# Patient Record
Sex: Female | Born: 2000 | Hispanic: Yes | Marital: Single | Smoking: Never smoker
Health system: Southern US, Community
[De-identification: ages and names within clinical notes are randomized; demographics above are authoritative.]

---
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
## 2022-03-30 ENCOUNTER — Ambulatory Visit: Payer: Medicaid Other | Admitting: Podiatry

## 2022-04-10 ENCOUNTER — Ambulatory Visit: Payer: Medicaid Other | Admitting: Podiatry

## 2022-06-20 ENCOUNTER — Ambulatory Visit: Payer: Medicaid Other | Admitting: Podiatry

## 2023-03-01 ENCOUNTER — Ambulatory Visit: Payer: Medicaid Other | Admitting: Podiatry

## 2023-03-08 ENCOUNTER — Ambulatory Visit: Payer: Medicaid Other | Admitting: Podiatry

## 2023-03-08 DIAGNOSIS — L600 Ingrowing nail: Secondary | ICD-10-CM | POA: Diagnosis not present

## 2023-03-08 DIAGNOSIS — L603 Nail dystrophy: Secondary | ICD-10-CM

## 2023-03-08 MED ORDER — TERBINAFINE HCL 250 MG PO TABS: 250.0000 mg | ORAL_TABLET | Freq: Every day | ORAL | 2 refills | Status: AC

## 2023-03-08 NOTE — Patient Instructions (Signed)

## 2023-03-08 NOTE — Progress Notes (Signed)
  Subjective:  Patient ID: Megan Leonard, female    DOB: July 02, 2000,  MRN: [id number]  Chief Complaint  Patient presents with   Ingrown Toenail    Bilateral     22 y.o. female presents with concern for ingrown nail on the bilateral hallux nail.  Having pain with both nails due to the thickness and abnormal growth.  She also notes that there is discoloration to the nails.  No past medical history on file.  No Known Allergies  ROS: Negative except as per HPI above  Objective:  General: AAO x3, NAD  Dermatological: Incurvation is present along the proximal nail border of the bilateral great toe. There is localized edema without any erythema or increase in warmth around the nail border.  There is thickness dystrophy and discoloration of bilateral hallux nails  Vascular:  Dorsalis Pedis artery and Posterior Tibial artery pedal pulses are 2/4 bilateral.  Capillary fill time < 3 sec to all digits.   Neruologic: Grossly intact via light touch bilateral. Protective threshold intact to all sites bilateral.   Musculoskeletal: No gross boney pedal deformities bilateral. No pain, crepitus, or limitation noted with foot and ankle range of motion bilateral. Muscular strength 5/5 in all groups tested bilateral.  Gait: Unassisted, Nonantalgic.   No images are attached to the encounter.   Assessment:   1. Ingrown nail of great toe of right foot   2. Ingrown nail of great toe of left foot   3. Nail dystrophy      Plan:  Patient was evaluated and treated and all questions answered.  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  Nails will regrow. -While they regard recommend treating with 90 days of oral terbinafine 250 mg once daily.  E Rx sent to the patient's pharmacy.  Discussed risk of hepatic toxicity with this. -Patient elects to proceed with minor surgery to remove ingrown toenail today. Consent  reviewed and signed by patient. -Ingrown nail excised. See procedure note. -Educated on post-procedure care including soaking. Written instructions provided and reviewed. -Patient to follow up in 2 weeks for nail check.  Procedure: Excision of Ingrown Toenail Location: Bilateral 1st toe total nail  Anesthesia: Lidocaine 1% plain; 1.5 mL and Marcaine 0.5% plain; 1.5 mL, digital block. Skin Prep: Betadine. Dressing: Silvadene; telfa; dry, sterile, compression dressing. Technique: Following skin prep, the toe was exsanguinated and a tourniquet was secured at the base of the toe. The affected nail border was freed, split with a nail splitter, and excised.  Nailbed was irrigated out with alcohol. The tourniquet was then removed and sterile dressing applied. Disposition: Patient tolerated procedure well. Patient to return in 2 weeks for follow-up.    Return in about 2 weeks (around 03/22/2023) for nail check.          Corinna Gab, DPM [facility]

## 2023-03-09 ENCOUNTER — Telehealth: Payer: Self-pay | Admitting: Podiatry

## 2023-03-09 NOTE — Telephone Encounter (Signed)
Pt is coming in to pick up a work note for today.

## 2023-03-12 ENCOUNTER — Encounter: Payer: Self-pay | Admitting: Podiatry

## 2023-03-22 ENCOUNTER — Ambulatory Visit: Payer: Medicaid Other | Admitting: Podiatry

## 2023-03-22 ENCOUNTER — Encounter: Payer: Self-pay | Admitting: Podiatry

## 2023-03-22 VITALS — Ht 61.0 in | Wt 90.0 lb

## 2023-03-22 DIAGNOSIS — L600 Ingrowing nail: Secondary | ICD-10-CM

## 2023-03-22 NOTE — Progress Notes (Signed)
       Subjective:  Patient ID: Megan Leonard, female    DOB: 2001-03-06,  MRN: [id number]  Chief Complaint  Patient presents with   Nail Problem    2 week F/U right ingrown removed    Megan Leonard presents to clinic today for f/u of PNA to the bilateral hallux  PCP is Patient, No Pcp Per.  No Known Allergies  Objective:  Vascular Examination: Capillary refill time is 3-5 seconds to toes bilateral. Palpable pedal pulses b/l LE. Digital hair present b/l. No pedal edema b/l. Skin temperature gradient WNL b/l. No varicosities b/l. No cyanosis or clubbing noted b/l.   Dermatological Examination: Upon inspection of the PNA site, there are no clinical signs of infection.  No purulence, no necrosis, no malodor present.  Minimal to no erythema present due to phenol chemical reaction.  Eschar formed along nail margin.  Minimal to no pain on palpation of area.   Assessment/Plan: 1. Ingrown nail of great toe of right foot   2. Ingrown nail of great toe of left foot    D/C post-PNA instructions.  Return if symptoms worsen or fail to improve.   Clerance Lav, DPM, FACFAS [facility]     [address] [place], [place] [address]                Office [phone]  Fax [phone]
